# Patient Record
Sex: Female | Born: 1950 | Hispanic: Yes | Marital: Single | Smoking: Never smoker
Health system: Southern US, Community
[De-identification: ages and names within clinical notes are randomized; demographics above are authoritative.]

## PROBLEM LIST (undated history)

## (undated) DIAGNOSIS — E079 Disorder of thyroid, unspecified: Secondary | ICD-10-CM

## (undated) DIAGNOSIS — I10 Essential (primary) hypertension: Secondary | ICD-10-CM

## (undated) HISTORY — PX: JOINT REPLACEMENT: SHX530

---

## 2018-08-12 ENCOUNTER — Emergency Department (HOSPITAL_COMMUNITY): Payer: Medicare (Managed Care)

## 2018-08-12 ENCOUNTER — Other Ambulatory Visit: Payer: Self-pay

## 2018-08-12 ENCOUNTER — Encounter (HOSPITAL_COMMUNITY): Payer: Self-pay | Admitting: Emergency Medicine

## 2018-08-12 ENCOUNTER — Emergency Department (HOSPITAL_COMMUNITY)
Admission: EM | Admit: 2018-08-12 | Discharge: 2018-08-12 | Disposition: A | Discharge status: Home / Self Care | Payer: Medicare (Managed Care) | Attending: Emergency Medicine | Admitting: Emergency Medicine

## 2018-08-12 DIAGNOSIS — I10 Essential (primary) hypertension: Secondary | ICD-10-CM | POA: Insufficient documentation

## 2018-08-12 DIAGNOSIS — Y998 Other external cause status: Secondary | ICD-10-CM | POA: Diagnosis not present

## 2018-08-12 DIAGNOSIS — S0990XA Unspecified injury of head, initial encounter: Secondary | ICD-10-CM

## 2018-08-12 DIAGNOSIS — S5002XA Contusion of left elbow, initial encounter: Secondary | ICD-10-CM | POA: Diagnosis not present

## 2018-08-12 DIAGNOSIS — Y92009 Unspecified place in unspecified non-institutional (private) residence as the place of occurrence of the external cause: Secondary | ICD-10-CM | POA: Diagnosis not present

## 2018-08-12 DIAGNOSIS — S40012A Contusion of left shoulder, initial encounter: Secondary | ICD-10-CM | POA: Insufficient documentation

## 2018-08-12 DIAGNOSIS — W109XXA Fall (on) (from) unspecified stairs and steps, initial encounter: Secondary | ICD-10-CM | POA: Insufficient documentation

## 2018-08-12 DIAGNOSIS — S0101XA Laceration without foreign body of scalp, initial encounter: Secondary | ICD-10-CM | POA: Diagnosis not present

## 2018-08-12 DIAGNOSIS — Y939 Activity, unspecified: Secondary | ICD-10-CM | POA: Diagnosis not present

## 2018-08-12 HISTORY — DX: Disorder of thyroid, unspecified: E07.9

## 2018-08-12 HISTORY — DX: Essential (primary) hypertension: I10

## 2018-08-12 LAB — URINALYSIS, ROUTINE W REFLEX MICROSCOPIC
Bilirubin Urine: NEGATIVE
Glucose, UA: NEGATIVE mg/dL
Hgb urine dipstick: NEGATIVE
KETONES UR: NEGATIVE mg/dL
Leukocytes, UA: NEGATIVE
Nitrite: NEGATIVE
Protein, ur: NEGATIVE mg/dL
Specific Gravity, Urine: 1.009 (ref 1.005–1.030)
pH: 7 (ref 5.0–8.0)

## 2018-08-12 MED ORDER — HYDROMORPHONE HCL 1 MG/ML IJ SOLN
0.5000 mg | Freq: Once | INTRAMUSCULAR | Status: AC
  Administered 2018-08-12: 0.5 mg via INTRAMUSCULAR
  Filled 2018-08-12: qty 1

## 2018-08-12 MED ORDER — OXYCODONE-ACETAMINOPHEN 5-325 MG PO TABS
1.0000 | ORAL_TABLET | Freq: Once | ORAL | Status: AC
  Administered 2018-08-12: 1 via ORAL
  Filled 2018-08-12: qty 1

## 2018-08-12 MED ORDER — HYDROCODONE-ACETAMINOPHEN 5-325 MG PO TABS: 1.0000 | ORAL_TABLET | Freq: Four times a day (QID) | ORAL | 0 refills | Status: AC | PRN

## 2018-08-12 MED ORDER — LIDOCAINE-EPINEPHRINE-TETRACAINE (LET) SOLUTION
3.0000 mL | Freq: Once | NASAL | Status: AC
  Administered 2018-08-12: 3 mL via TOPICAL
  Filled 2018-08-12: qty 3

## 2018-08-12 NOTE — ED Notes (Signed)
Patient transported to X-ray 

## 2018-08-12 NOTE — Discharge Instructions (Signed)
Please monitor wound on scalp for any signs of infection.  Have your staple remove in 7 days at the Urgent Care center.  Wear sling as needed for support.  Continue taking your pain medications at home, you may take vicodin if pain is not well control.  Return if you have any concerns.

## 2018-08-12 NOTE — ED Provider Notes (Signed)
[facility] Provider Note   CSN: [id number] Arrival date & time: 08/12/18  1842     History   Chief Complaint Chief Complaint  Patient presents with  . Fall    HPI Janet Santiago is a 67 y.o. female.  The history is provided by the patient and a relative. The history is limited by a language barrier. A language interpreter was used.  Fall      67 year old female with history of hypertension, thyroid disease brought here via EMS from home for evaluation of an unwitnessed fall.  Patient is here for the holiday visiting her daughter.  Today, she was walking down the steps, slipped, fell down several flights of stairs and laid on the ground.  Family member heard the fall and was able to immediately come to her aid.  They did not recall any loss of consciousness but they did notice blood to the back of her head and patient was brought here via EMS for further care.  History obtained through daughter who serves as a Engineer, technical saleslanguage interpreter.  Currently patient does complain of mild throbbing headache to the back of her head as well as pain to her left shoulder and left elbow.  Pain is mild to moderate in severity and nonradiating, worsening with movement.  She denies any precipitant prior to the fall.  She does not complain of any significant vision changes, neck pain, chest pain, abdominal pain, lower back pain, hip pain or lower leg pain.  She is up-to-date with immunization.  She denies any numbness weakness.  She did report prior knee joint replacement but does not complain of any significant knee discomfort.  Past Medical History:  Diagnosis Date  . Hypertension   . Thyroid disease     There are no active problems to display for this patient.   Past Surgical History:  Procedure Laterality Date  . JOINT REPLACEMENT       OB History   No obstetric history on file.      Home Medications    Prior to Admission medications   Not on File      Family History No family history on file.  Social History Social History   Tobacco Use  . Smoking status: Never Smoker  . Smokeless tobacco: Never Used  Substance Use Topics  . Alcohol use: Yes  . Drug use: Never     Allergies   Patient has no known allergies.   Review of Systems Review of Systems  All other systems reviewed and are negative.    Physical Exam Updated Vital Signs BP (!) 182/87   Pulse 80   Temp 98.2 F (36.8 C) (Oral)   Resp 20   SpO2 100%   Physical Exam Vitals signs and nursing note reviewed.  Constitutional:      General: She is not in acute distress.    Appearance: Normal appearance. She is well-developed.  HENT:     Head: Normocephalic.     Comments: There is less than 1 cm laceration noted to left parietal scalp not actively bleeding but it is tender to palpation without any crepitus.  No hemotympanum, no septal hematoma, no malocclusion, no midface tenderness, no raccoon's eyes or battle sign. Eyes:     Conjunctiva/sclera: Conjunctivae normal.  Neck:     Musculoskeletal: Neck supple.     Comments: No cervical midline spine tenderness crepitus step-off Cardiovascular:     Rate and Rhythm: Normal rate and regular rhythm.  Pulses: Normal pulses.     Heart sounds: Normal heart sounds.  Pulmonary:     Effort: Pulmonary effort is normal.     Breath sounds: Normal breath sounds.  Abdominal:     Palpations: Abdomen is soft.     Tenderness: There is no abdominal tenderness.  Musculoskeletal:        General: Tenderness (Left shoulder: Tenderness to posterior shoulder and at the deltoid with hematoma noted, full range of motion without deformity.  Left elbow: Ecchymosis noted to the posterior elbow with normal flexion and extension and no deformity.) present.     Comments: Patient able to move all 4 extremities without difficulty.  Skin:    Findings: No rash.  Neurological:     Mental Status: She is alert and oriented to person,  place, and time.      ED Treatments / Results  Labs (all labs ordered are listed, but only abnormal results are displayed) Labs Reviewed  URINALYSIS, ROUTINE W REFLEX MICROSCOPIC - Abnormal; Notable for the following components:      Result Value   Color, Urine STRAW (*)    All other components within normal limits    EKG None   Date: 08/12/2018  Rate: 79  Rhythm: normal sinus rhythm  QRS Axis: normal  Intervals: normal  ST/T Wave abnormalities: normal  Conduction Disutrbances: none  Narrative Interpretation:   Old EKG Reviewed: No significant changes noted     Radiology Dg Elbow Complete Left  Result Date: 08/12/2018 CLINICAL DATA:  Fall. Left elbow pain and bruising. Initial encounter. EXAM: LEFT ELBOW - COMPLETE 3+ VIEW COMPARISON:  None. FINDINGS: There is no evidence of fracture, dislocation, or joint effusion. There is no evidence of arthropathy or other focal bone abnormality. Soft tissue swelling seen along the dorsal aspect of the proximal ulna. IMPRESSION: Dorsal soft tissue swelling. No evidence of fracture or dislocation. Electronically Signed   By: Myles Rosenthal M.D.   On: 08/12/2018 20:26   Ct Head Wo Contrast  Result Date: 08/12/2018 CLINICAL DATA:  Fall downstairs with loss of consciousness, initial encounter EXAM: CT HEAD WITHOUT CONTRAST TECHNIQUE: Contiguous axial images were obtained from the base of the skull through the vertex without intravenous contrast. COMPARISON:  None. FINDINGS: Brain: No acute hemorrhage, acute infarction or space-occupying mass lesion is seen. Basal ganglia calcifications are noted. Vascular: No hyperdense vessel or unexpected calcification. Skull: Normal. Negative for fracture or focal lesion. Sinuses/Orbits: No acute finding. Other: Scalp hematoma is noted on the left in the parietal region. IMPRESSION: Scalp hematoma without acute intracranial abnormality noted. Electronically Signed   By: Alcide Clever M.D.   On: 08/12/2018 20:36     Dg Shoulder Left  Result Date: 08/12/2018 CLINICAL DATA:  Fall. Left shoulder bruising and pain. Initial encounter. EXAM: LEFT SHOULDER - 2+ VIEW COMPARISON:  None. FINDINGS: There is no evidence of fracture or dislocation. There is no evidence of arthropathy or other focal bone abnormality. Soft tissues are unremarkable. IMPRESSION: Negative. Electronically Signed   By: Myles Rosenthal M.D.   On: 08/12/2018 20:25    Procedures .Marland KitchenLaceration Repair Date/Time: 08/12/2018 9:17 PM Performed by: Fayrene Helper, PA-C Authorized by: Fayrene Helper, PA-C   Consent:    Consent obtained:  Verbal   Consent given by:  Patient   Risks discussed:  Infection, need for additional repair, pain, poor cosmetic result and poor wound healing   Alternatives discussed:  No treatment and delayed treatment Universal protocol:    Procedure explained and  questions answered to patient or proxy's satisfaction: yes     Relevant documents present and verified: yes     Test results available and properly labeled: yes     Imaging studies available: yes     Required blood products, implants, devices, and special equipment available: yes     Site/side marked: yes     Immediately prior to procedure, a time out was called: yes     Patient identity confirmed:  Verbally with patient Anesthesia (see MAR for exact dosages):    Anesthesia method:  Topical application   Topical anesthetic:  LET Laceration details:    Location:  Scalp   Scalp location:  L parietal   Length (cm):  1   Depth (mm):  2 Repair type:    Repair type:  Simple Pre-procedure details:    Preparation:  Imaging obtained to evaluate for foreign bodies and patient was prepped and draped in usual sterile fashion Exploration:    Hemostasis achieved with:  LET   Contaminated: no   Treatment:    Area cleansed with:  Betadine Skin repair:    Repair method:  Staples   Number of staples:  1 Approximation:    Approximation:  Close Post-procedure details:     Dressing:  Open (no dressing)   Patient tolerance of procedure:  Tolerated well, no immediate complications   (including critical care time)  Medications Ordered in ED Medications  oxyCODONE-acetaminophen (PERCOCET/ROXICET) 5-325 MG per tablet 1 tablet (1 tablet Oral Given 08/12/18 1919)  lidocaine-EPINEPHrine-tetracaine (LET) solution (3 mLs Topical Given 08/12/18 1934)  HYDROmorphone (DILAUDID) injection 0.5 mg (0.5 mg Intramuscular Given 08/12/18 2055)     Initial Impression / Assessment and Plan / ED Course  I have reviewed the triage vital signs and the nursing notes.  Pertinent labs & imaging results that were available during my care of the patient were reviewed by me and considered in my medical decision making (see chart for details).     BP (!) 170/75   Pulse 79   Temp 98.2 F (36.8 C) (Oral)   Resp 14   SpO2 99%    Final Clinical Impressions(s) / ED Diagnoses   Final diagnoses:  Laceration of scalp, initial encounter  Minor head injury, initial encounter  Contusion of left shoulder, initial encounter  Contusion of left elbow, initial encounter    ED Discharge Orders         Ordered    HYDROcodone-acetaminophen (NORCO/VICODIN) 5-325 MG tablet  Every 6 hours PRN     08/12/18 2128         7:05 PM Patient fell down several flight of stairs, no true syncopal episode and no precipitating symptoms.  She does have a scalp laceration that will benefit from repair.  She has ecchymosis to her left shoulder and left elbow, x-rays ordered.  Head CT scan ordered.  She is up-to-date with tetanus.  Pain medication given, will also obtain EKG and UA.  9:13 PM UA and EKG without concerning changes.  Head CT scan show no evidence of intracranial injury or bony injury.  Scalp hematoma were noted.  X-ray of left elbow and shoulder without acute fractures or dislocation.  Patient scalp laceration was repaired by me.  Sling provided to left arm for support and comfort.  Patient  discharged home with opiate pain medication to use as needed.  Recommend follow-up with urgent care center in 7 days for surgical staple removal.  Wound care instruction provided.   Laveda Normanran,  Kelton Pillar 08/12/18 2133    Azalia Bilis, MD 08/12/18 [phone]

## 2018-08-12 NOTE — ED Notes (Signed)
Reviewed d/c instructions with pt, who verbalized understanding and had no outstanding questions. CD of x-rays provided by Radiology upon pt request. Pt departed in NAD.

## 2018-08-12 NOTE — ED Triage Notes (Signed)
Pt BIB [facility], witnessed fall down stairs, + LOC <30seconds, A&O x 4. C/o left shoulder pain and headache. Pt not taking blood thinners. EMS vitals: BP 180/100, HE 74, SpO2 96% room air.

## 2018-08-16 ENCOUNTER — Emergency Department (HOSPITAL_BASED_OUTPATIENT_CLINIC_OR_DEPARTMENT_OTHER): Payer: Medicare (Managed Care)

## 2018-08-16 ENCOUNTER — Emergency Department (HOSPITAL_BASED_OUTPATIENT_CLINIC_OR_DEPARTMENT_OTHER)
Admission: EM | Admit: 2018-08-16 | Discharge: 2018-08-16 | Disposition: A | Discharge status: Home / Self Care | Payer: Medicare (Managed Care) | Attending: Emergency Medicine | Admitting: Emergency Medicine

## 2018-08-16 ENCOUNTER — Other Ambulatory Visit: Payer: Self-pay

## 2018-08-16 ENCOUNTER — Encounter (HOSPITAL_BASED_OUTPATIENT_CLINIC_OR_DEPARTMENT_OTHER): Payer: Self-pay | Admitting: Emergency Medicine

## 2018-08-16 DIAGNOSIS — W108XXD Fall (on) (from) other stairs and steps, subsequent encounter: Secondary | ICD-10-CM | POA: Insufficient documentation

## 2018-08-16 DIAGNOSIS — S0990XA Unspecified injury of head, initial encounter: Secondary | ICD-10-CM | POA: Diagnosis present

## 2018-08-16 DIAGNOSIS — S0083XA Contusion of other part of head, initial encounter: Secondary | ICD-10-CM | POA: Insufficient documentation

## 2018-08-16 DIAGNOSIS — Y999 Unspecified external cause status: Secondary | ICD-10-CM | POA: Diagnosis not present

## 2018-08-16 DIAGNOSIS — I10 Essential (primary) hypertension: Secondary | ICD-10-CM | POA: Insufficient documentation

## 2018-08-16 DIAGNOSIS — Y929 Unspecified place or not applicable: Secondary | ICD-10-CM | POA: Insufficient documentation

## 2018-08-16 DIAGNOSIS — Y939 Activity, unspecified: Secondary | ICD-10-CM | POA: Insufficient documentation

## 2018-08-16 NOTE — ED Triage Notes (Signed)
Pt fell on christmas eve, she was seen at [facility] with neg head CT. Pt daughter is concerned because some new bruising and swelling noted around L eye. Denies pain.

## 2018-08-16 NOTE — ED Notes (Signed)
Patient transported to CT 

## 2018-08-16 NOTE — Discharge Instructions (Addendum)
You were evaluated in the Emergency Department and after careful evaluation, we did not find any emergent condition requiring admission or further testing in the hospital.  Your symptoms today seem to be due to bruising related to your recent fall.  A repeat CT scan of your head today was reassuring.  Avoid the diclofenac medication and use Tylenol at home for pain.  You can use the Vicodin prescription at night if you are having pain keeping you from sleeping.  Please return to the Emergency Department if you experience any worsening of your condition.  We encourage you to follow up with a primary care provider.  Thank you for allowing us to be a part of your care.

## 2018-08-16 NOTE — ED Notes (Signed)
ED Provider at bedside. 

## 2018-08-16 NOTE — ED Provider Notes (Signed)
[facility] Emergency Department Provider Note MRN:  [id number]  Arrival date & time: 08/16/18     Chief Complaint   Facial Swelling   History of Present Illness   Janet Santiago is a 67 y.o. year-old female with a history of hypertension presenting to the ED with chief complaint of bruising.  Patient fell down multiple stairs 4 days ago.  Sustained head trauma, pain to the left shoulder, elbow, hip.  [facility] with negative CT head, laceration of the scalp was repaired, was discharged.  Family more concerned about increased bruising for the past 1 to 2 days.  Bruising around the left eye, bruising behind the left ear.  Daughter also noticing the patient is taking more naps than normal for the past 2 days.  No vomiting, no numbness or weakness to the arms or legs, no significant pain.  No headache.  Review of Systems  A complete 10 system review of systems was obtained and all systems are negative except as noted in the HPI and PMH.   Patient's Health History    Past Medical History:  Diagnosis Date  . Hypertension   . Thyroid disease     Past Surgical History:  Procedure Laterality Date  . JOINT REPLACEMENT      No family history on file.  Social History   Socioeconomic History  . Marital status: Single    Spouse name: Not on file  . Number of children: Not on file  . Years of education: Not on file  . Highest education level: Not on file  Occupational History  . Not on file  Social Needs  . Financial resource strain: Not on file  . Food insecurity:    Worry: Not on file    Inability: Not on file  . Transportation needs:    Medical: Not on file    Non-medical: Not on file  Tobacco Use  . Smoking status: Never Smoker  . Smokeless tobacco: Never Used  Substance and Sexual Activity  . Alcohol use: Yes  . Drug use: Never  . Sexual activity: Not on file  Lifestyle  . Physical activity:    Days per  week: Not on file    Minutes per session: Not on file  . Stress: Not on file  Relationships  . Social connections:    Talks on phone: Not on file    Gets together: Not on file    Attends religious service: Not on file    Active member of club or organization: Not on file    Attends meetings of clubs or organizations: Not on file    Relationship status: Not on file  . Intimate partner violence:    Fear of current or ex partner: Not on file    Emotionally abused: Not on file    Physically abused: Not on file    Forced sexual activity: Not on file  Other Topics Concern  . Not on file  Social History Narrative  . Not on file     Physical Exam  Vital Signs and Nursing Notes reviewed Vitals:   08/16/18 1110 08/16/18 1450  BP: 106/64 (!) 147/83  Pulse: 90 72  Resp: 16 16  Temp: 98.5 F (36.9 C)   SpO2: 96% 97%    CONSTITUTIONAL: Well-appearing, NAD NEURO:  Alert and oriented x 3, no focal deficits EYES:  eyes equal and reactive ENT/NECK:  no LAD, no JVD CARDIO: Regular rate, well-perfused, normal  S1 and S2 PULM:  CTAB no wheezing or rhonchi GI/GU:  normal bowel sounds, non-distended, non-tender MSK/SPINE:  No gross deformities, no edema SKIN:  no rash, left periorbital bruising, bruising over left mastoid process, well-healing stapled laceration to the left parietal scalp; bruising to the left shoulder and left elbow with normal range of motion, no significant tenderness.  Bruising to the left upper lateral thigh with no significant tenderness to the bones of the left hip. PSYCH:  Appropriate speech and behavior  Diagnostic and Interventional Summary    Labs Reviewed - No data to display  CT Head Wo Contrast  Final Result      Medications - No data to display   Procedures Critical Care  ED Course and Medical Decision Making  I have reviewed the triage vital signs and the nursing notes.  Pertinent labs & imaging results that were available during my care of the  patient were reviewed by me and considered in my medical decision making (see below for details).  Will CT to exclude delayed intracranial bleeding and to reevaluate for basilar skull fracture given the worsening bruising, but is likely just normal bruising related to the initial fall.  CT head normal, provided reassurance.  Patient advised to avoid ibuprofen or the prescribed diclofenac at home.  Will use Tylenol, has prescription for Vicodin from prior ED visit, advised to use if needed at night if pain is keeping her from sleeping.  After the discussed management above, the patient was determined to be safe for discharge.  The patient was in agreement with this plan and all questions regarding their care were answered.  ED return precautions were discussed and the patient will return to the ED with any significant worsening of condition.  Elmer SowMichael M. Pilar PlateBero, MD [facility] [email or web address]  Final Clinical Impressions(s) / ED Diagnoses     ICD-10-CM   1. Contusion of face, initial encounter S00.83XA     ED Discharge Orders    None         Sabas SousBero, Zionna Homewood M, MD 08/16/18 1524

## 2018-08-19 ENCOUNTER — Encounter (HOSPITAL_COMMUNITY): Payer: Self-pay

## 2018-08-19 ENCOUNTER — Ambulatory Visit (HOSPITAL_COMMUNITY)
Admission: EM | Admit: 2018-08-19 | Discharge: 2018-08-19 | Disposition: A | Discharge status: Home / Self Care | Payer: Medicare (Managed Care) | Attending: Internal Medicine | Admitting: Internal Medicine

## 2018-08-19 DIAGNOSIS — Z4802 Encounter for removal of sutures: Secondary | ICD-10-CM

## 2018-08-19 DIAGNOSIS — S0101XA Laceration without foreign body of scalp, initial encounter: Secondary | ICD-10-CM | POA: Diagnosis not present

## 2018-08-19 NOTE — ED Triage Notes (Signed)
Pt came in to have her staples removed that was placed on 08/12/18

## 2019-09-17 IMAGING — CT CT HEAD W/O CM
3 series · 16 of 47 positions shown, 19 images · non-contrast
Comparison: Brain CT 08/12/2018

CLINICAL DATA: Patient status post fall. Bruising and swelling
about the left eye.

EXAM:
CT HEAD WITHOUT CONTRAST
TECHNIQUE: Contiguous axial images were obtained from the base of the skull
through the vertex without intravenous contrast.

[Series 2: head wo · axial · 0.45mm/px · z∈[-179,-29]mm · 10 of 36 slices shown, 13 images]
[im 3/36  brain]
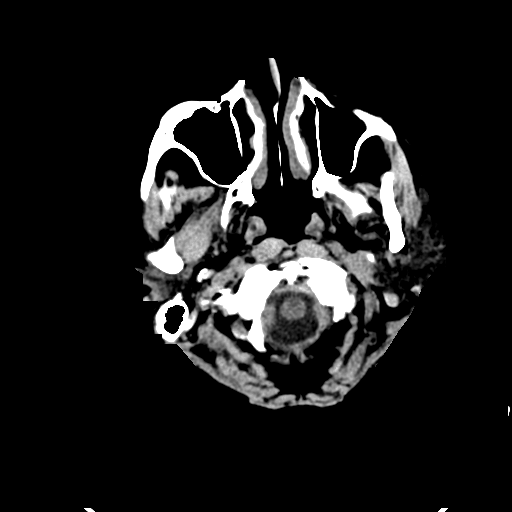
[im 3/36  bone]
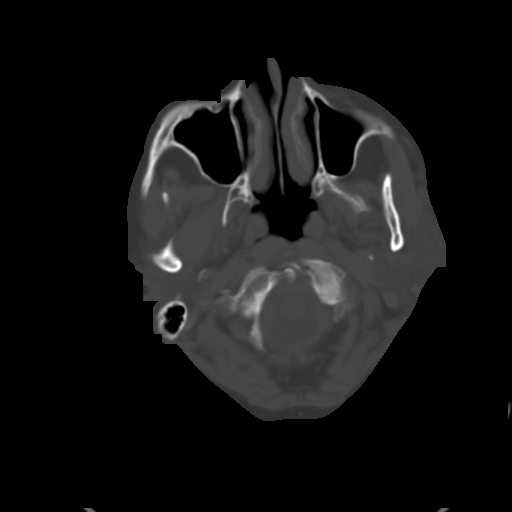
[im 7/36  brain]
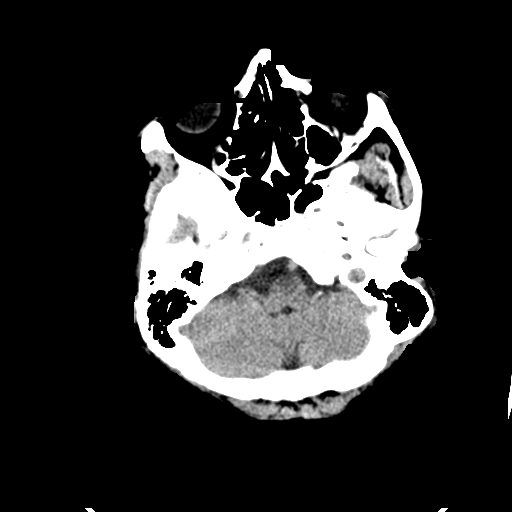
[im 10/36  brain]
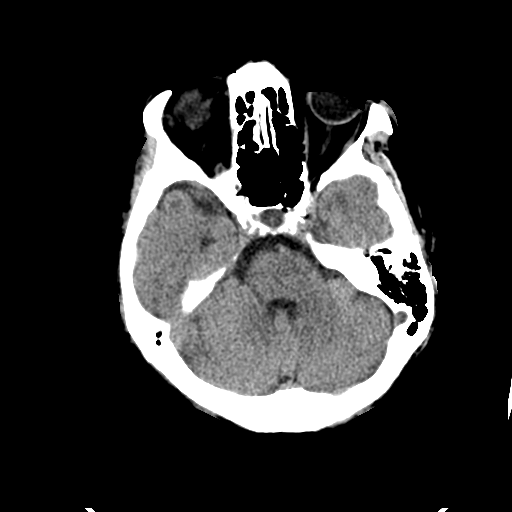
[im 13/36  brain]
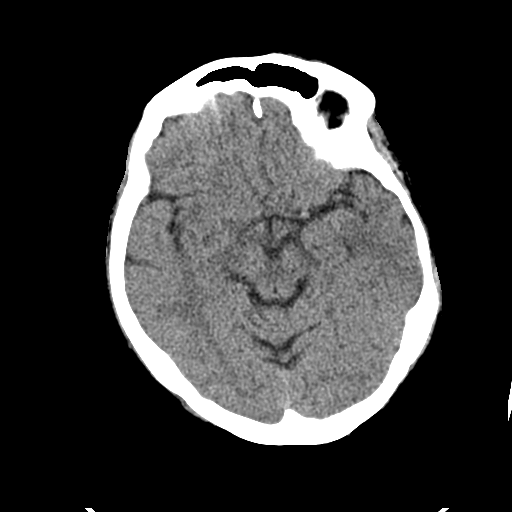
[im 16/36  brain]
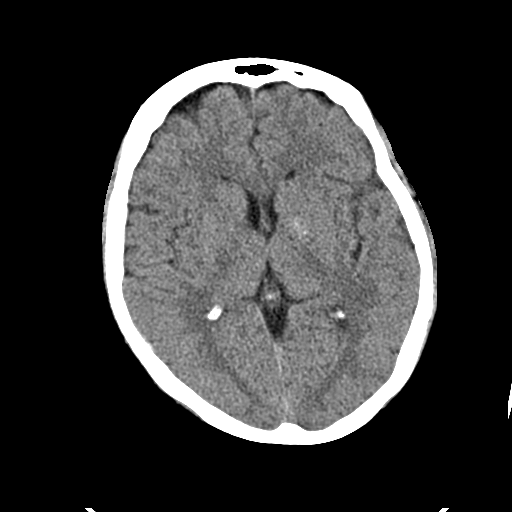
[im 16/36  bone]
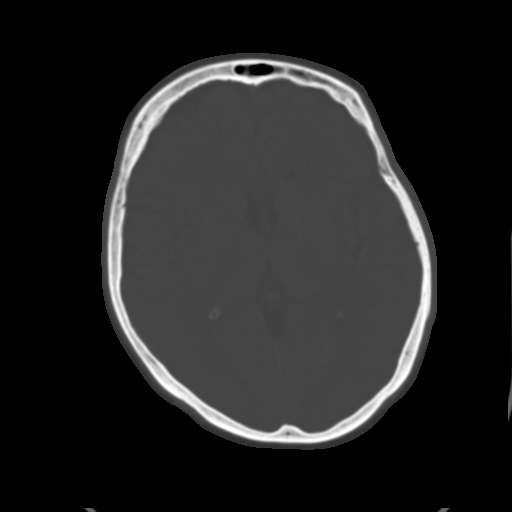
[im 20/36  brain]
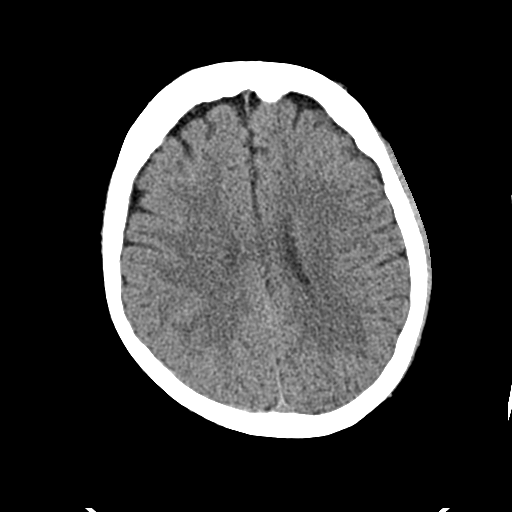
[im 23/36  brain]
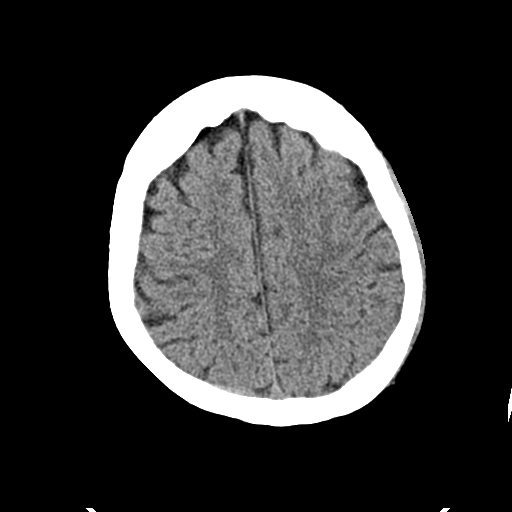
[im 27/36  brain]
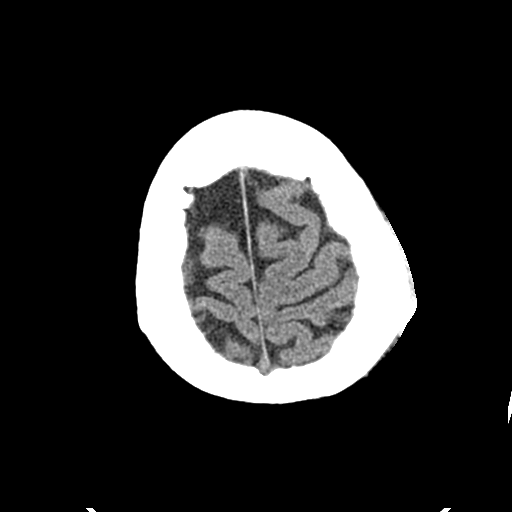
[im 29/36  brain]
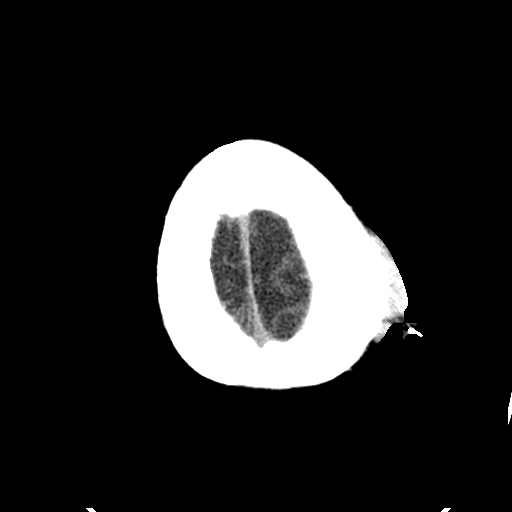
[im 29/36  bone]
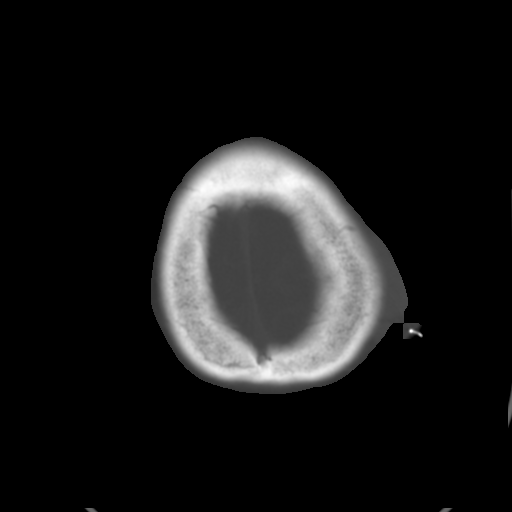
[im 33/36  brain]
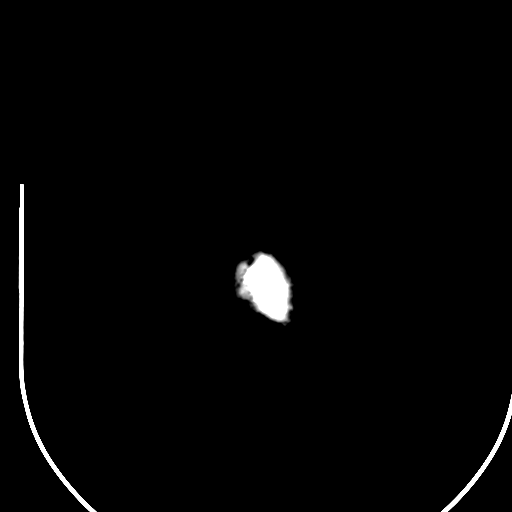

[Series 4: cor soft · coronal · 0.35mm/px · 3 of 84 slices shown]
[im 28/84  brain]
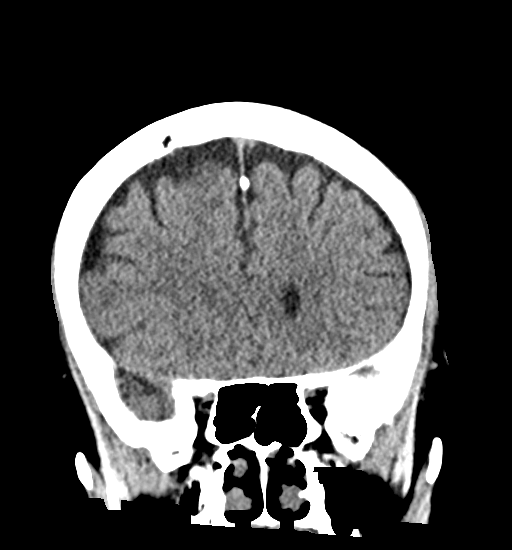
[im 37/84  brain]
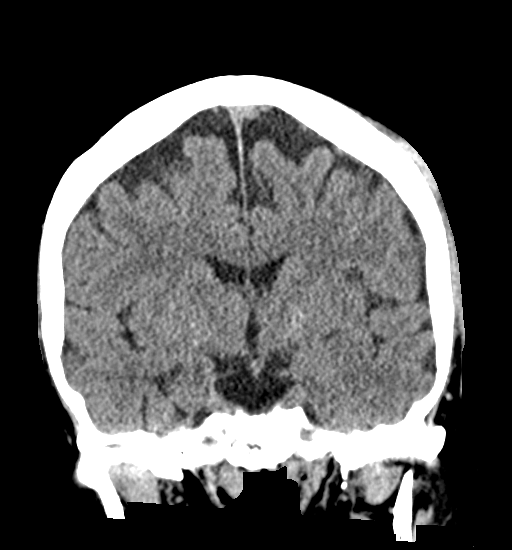
[im 47/84  brain]
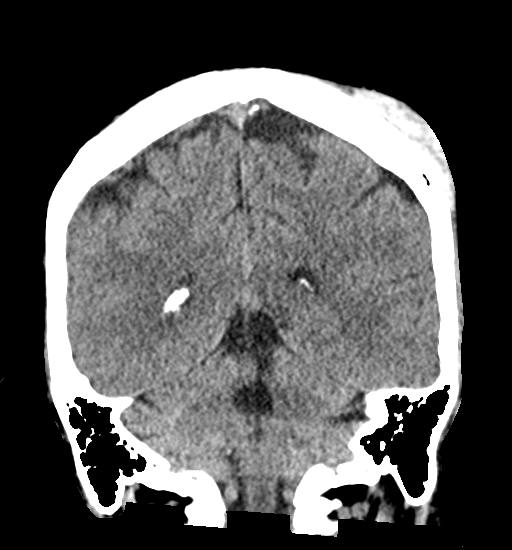

[Series 5: sag soft · sagittal · 0.37mm/px · 3 of 55 slices shown]
[im 19/55  brain]
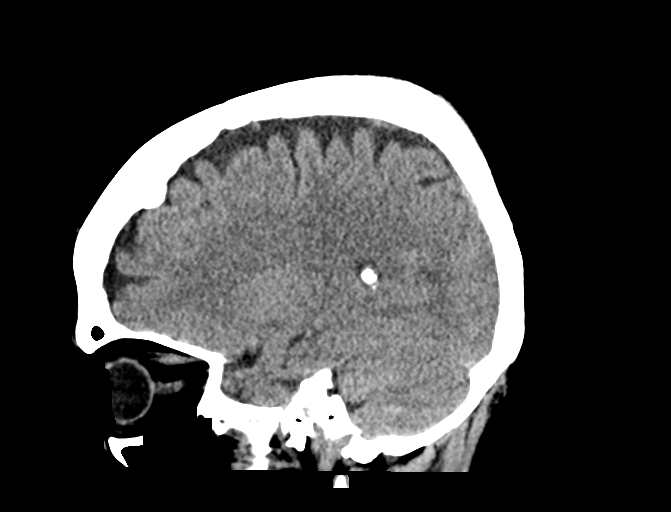
[im 28/55  brain]
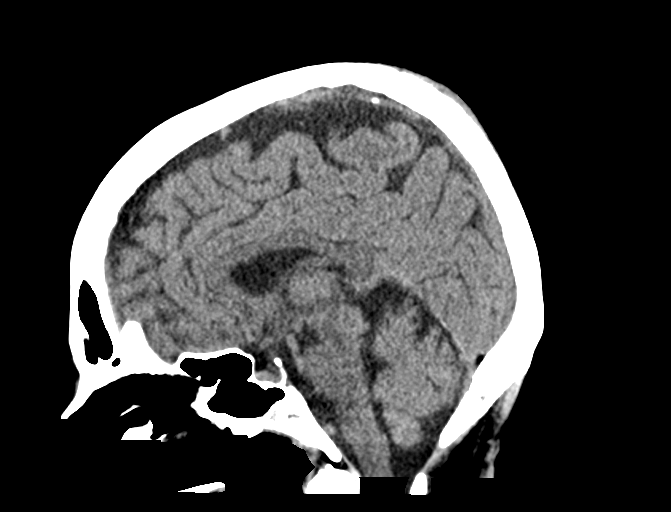
[im 37/55  brain]
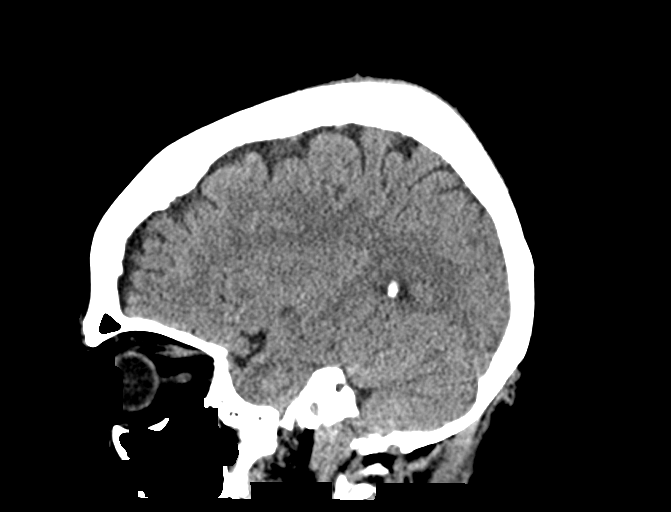

[16 of 47 positions shown; findings below may reference images not displayed]

FINDINGS: Brain: Ventricles and sulci are appropriate for patient's age. No
evidence for acute cortically based infarct, intracranial
hemorrhage, mass lesion or mass-effect. Bilateral basal ganglia
calcifications.

Vascular: Unremarkable

Skull: Intact.

Sinuses/Orbits: Paranasal sinuses are well aerated. Mastoid air
cells are unremarkable. Orbits are unremarkable.

Other: Interval decrease in size of soft tissue hematoma overlying
the left calvarium.
IMPRESSION: Interval decrease in size of soft tissue hematoma overlying the left
calvarium.

No acute intracranial process.
